# Patient Record
Sex: Female | Born: 2000 | Race: Black or African American | Hispanic: No | Marital: Single | Smoking: Never smoker
Health system: Southern US, Community
[De-identification: ages and names within clinical notes are randomized; demographics above are authoritative.]

## PROBLEM LIST (undated history)

## (undated) ENCOUNTER — Inpatient Hospital Stay (HOSPITAL_COMMUNITY): Payer: Self-pay

## (undated) DIAGNOSIS — Z789 Other specified health status: Secondary | ICD-10-CM

## (undated) HISTORY — PX: NO PAST SURGERIES: SHX2092

## (undated) HISTORY — DX: Other specified health status: Z78.9

---
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
## 2021-04-27 IMAGING — DX PORTABLE CHEST - 1 VIEW
1 series · 1 of 1 positions shown · non-contrast
Comparison: None.

CLINICAL DATA: Sepsis, syncope

EXAM:
PORTABLE CHEST 1 VIEW

[chest ap]
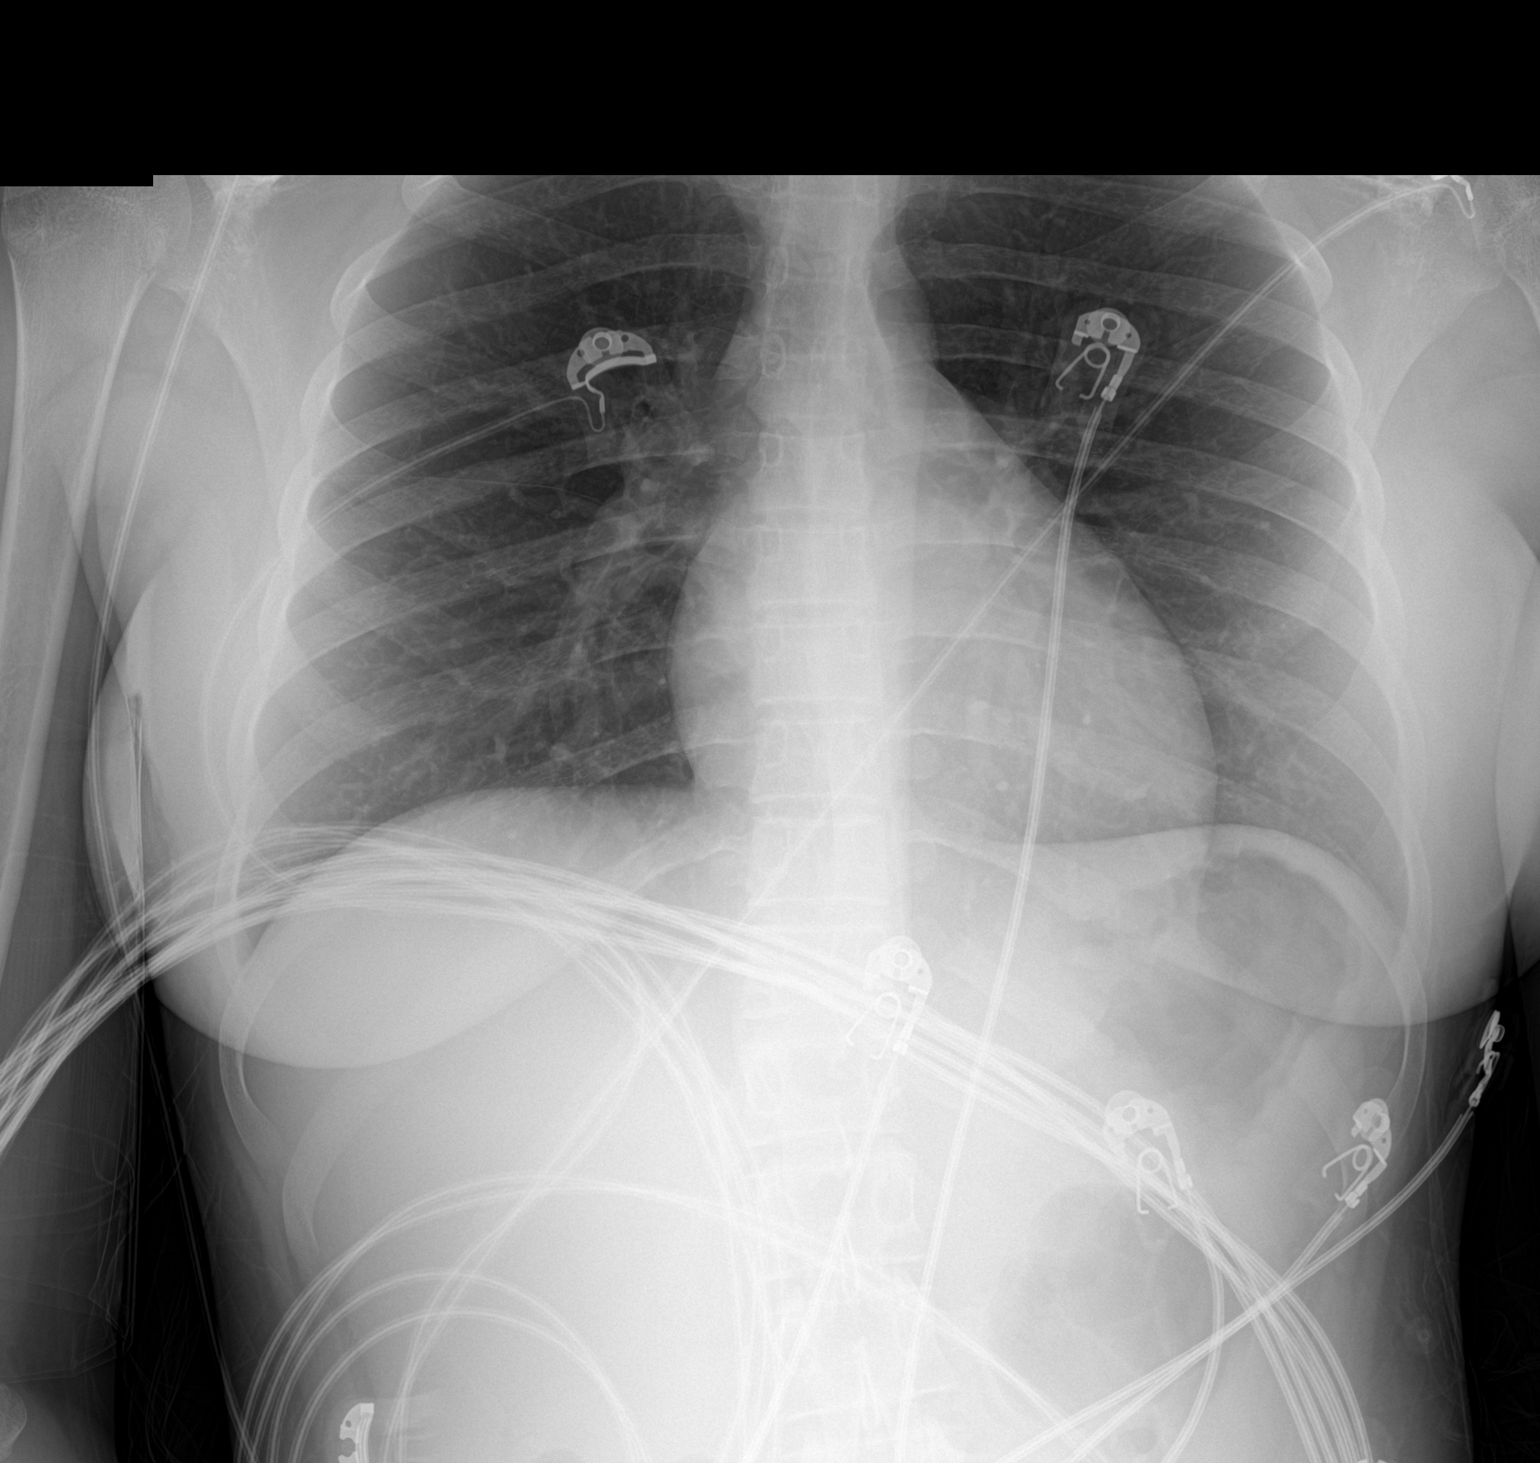

[1 of 1 positions shown; findings below may reference images not displayed]

FINDINGS: Heart and mediastinal contours are within normal limits. No focal
opacities or effusions. No acute bony abnormality.
IMPRESSION: No active disease.

## 2021-04-28 IMAGING — US US PELVIS COMPLETE
1 series · 13 of 25 positions shown · non-contrast
Comparison: None.

CLINICAL DATA: Fever, sepsis, evaluate for retained products of
conception. Three weeks postpartum. Lower abdominal pain.

EXAM:
TRANSABDOMINAL ULTRASOUND OF PELVIS
TECHNIQUE: Transabdominal ultrasound examination of the pelvis was performed
including evaluation of the uterus, ovaries, adnexal regions, and
pelvic cul-de-sac.

[Series 1: us pelvis complete · 51 acquisitions, 13 frames shown]
[im 1/51]
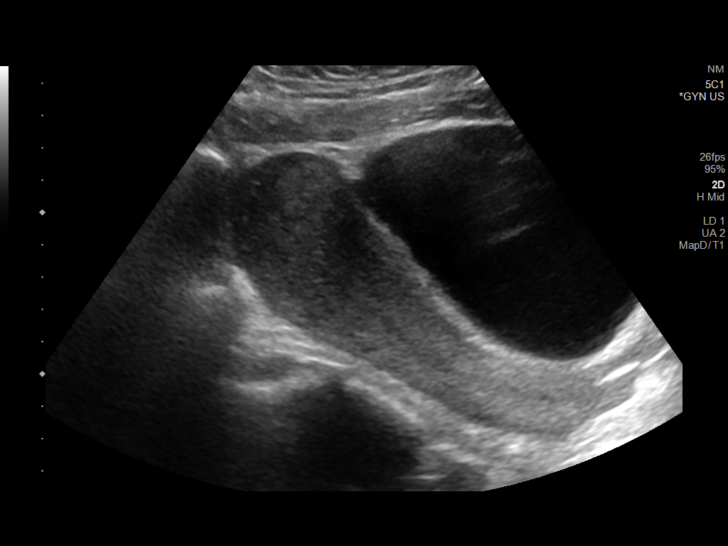
[im 5/51]
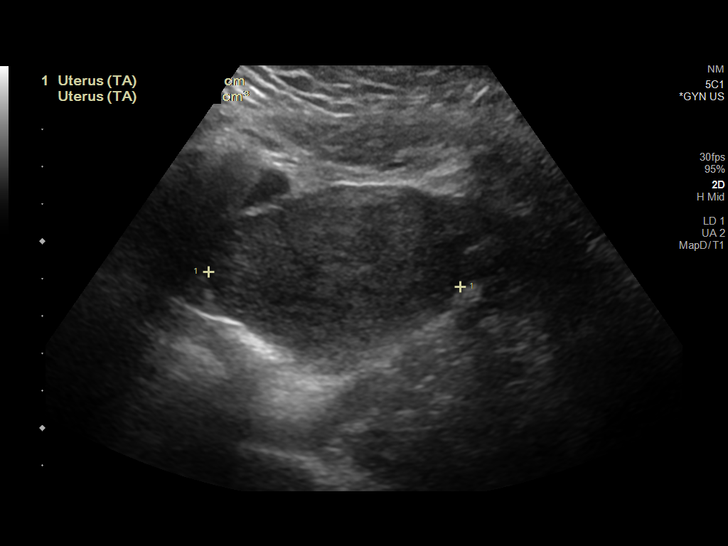
[im 9/51]
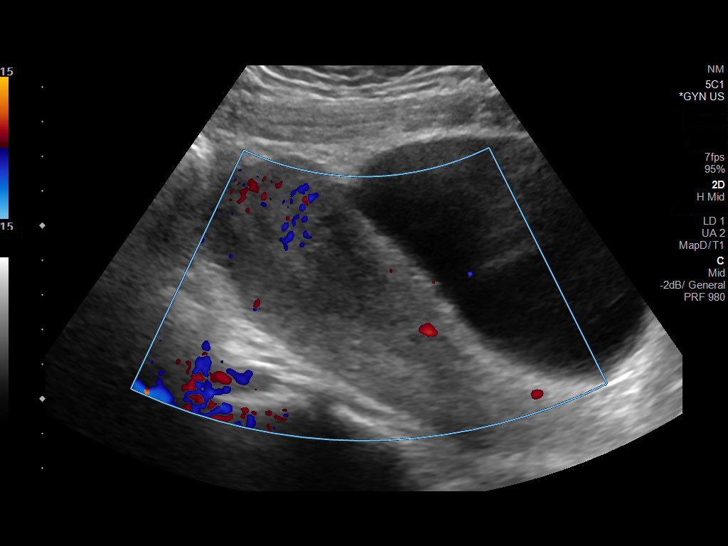
[im 13/51]
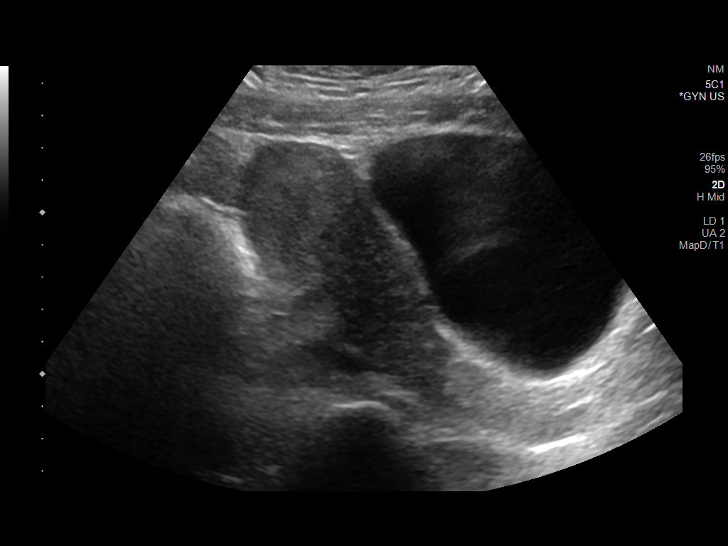
[im 17/51]
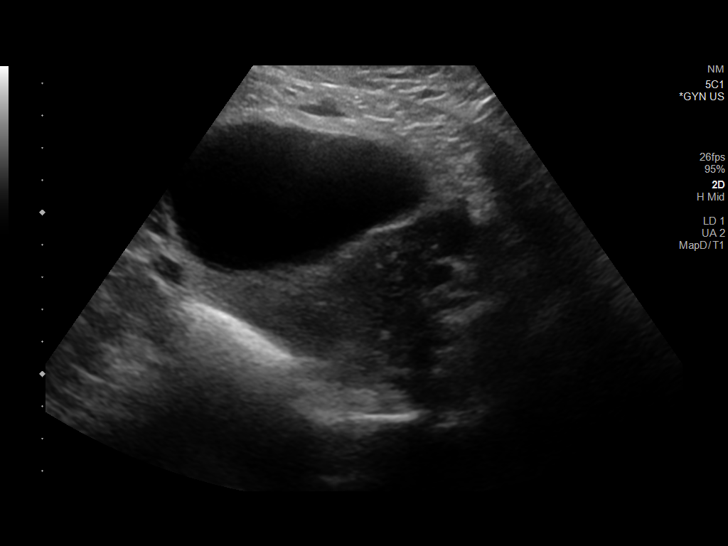
[im 21/51]
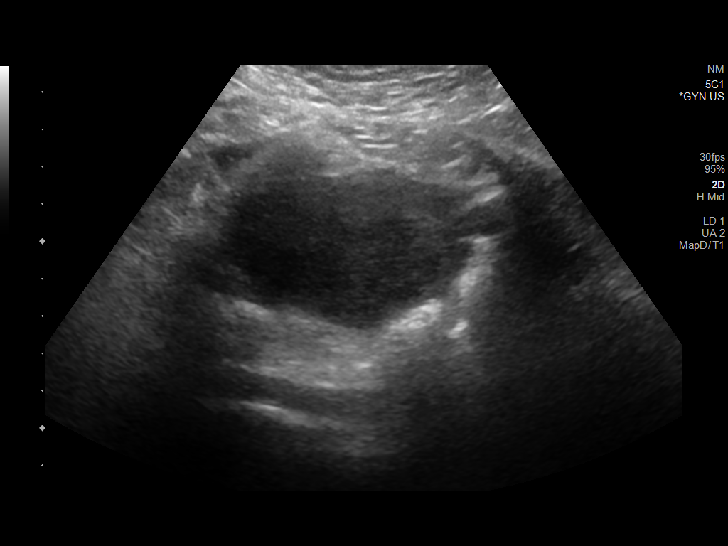
[im 26/51]
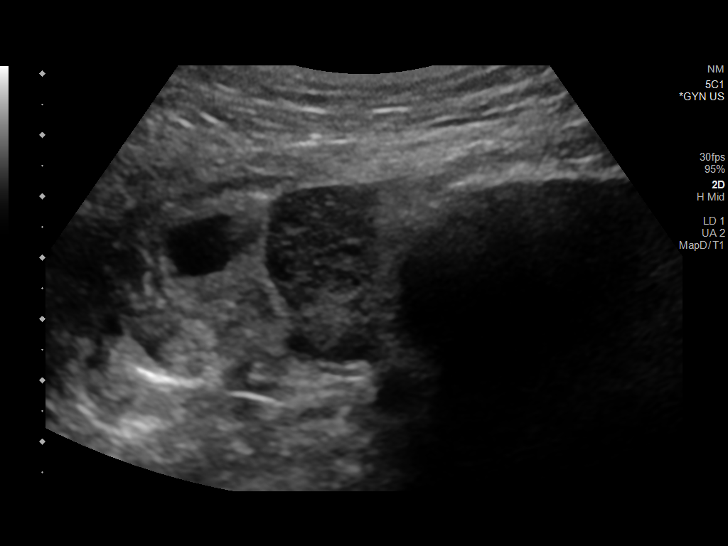
[im 30/51]
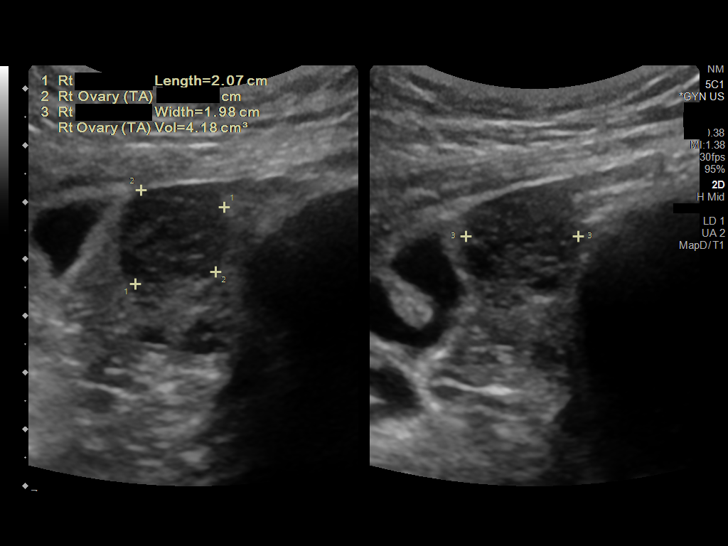
[im 34/51]
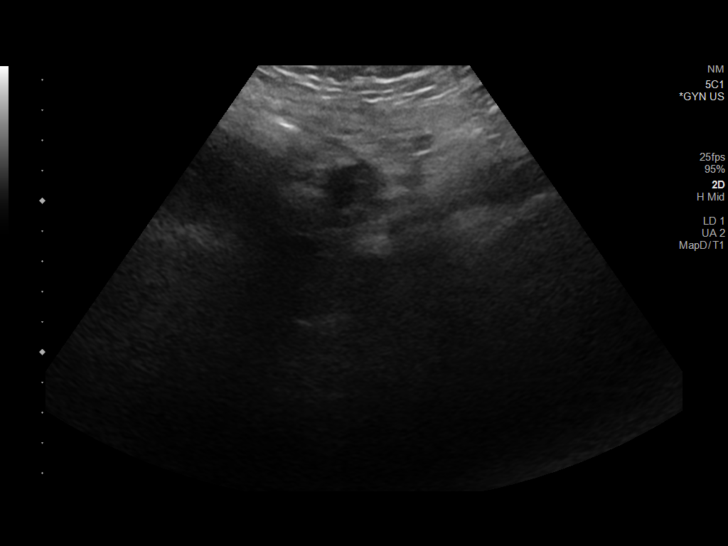
[im 38/51]
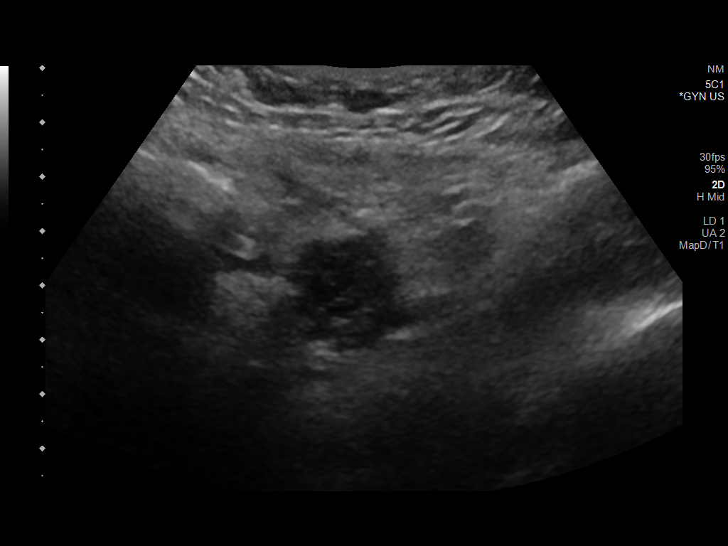
[im 42/51]
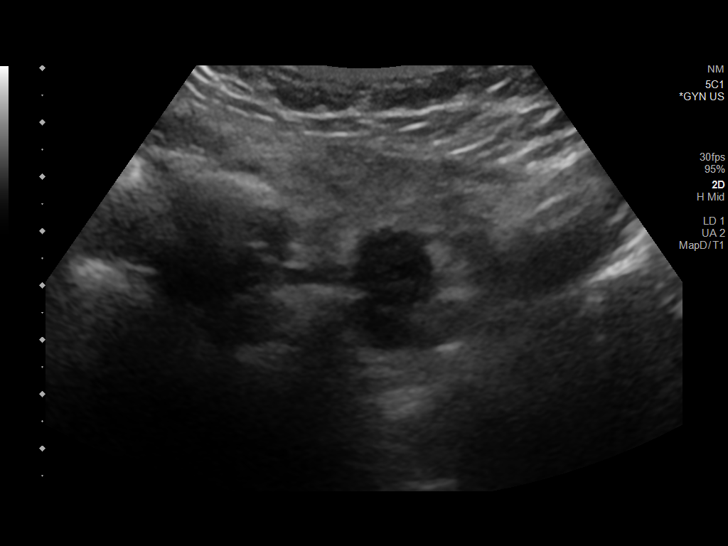
[im 46/51]
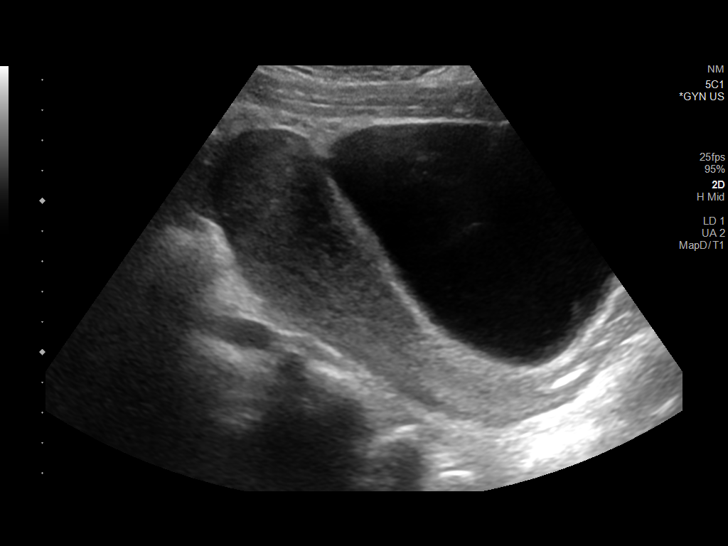
[im 51/51]
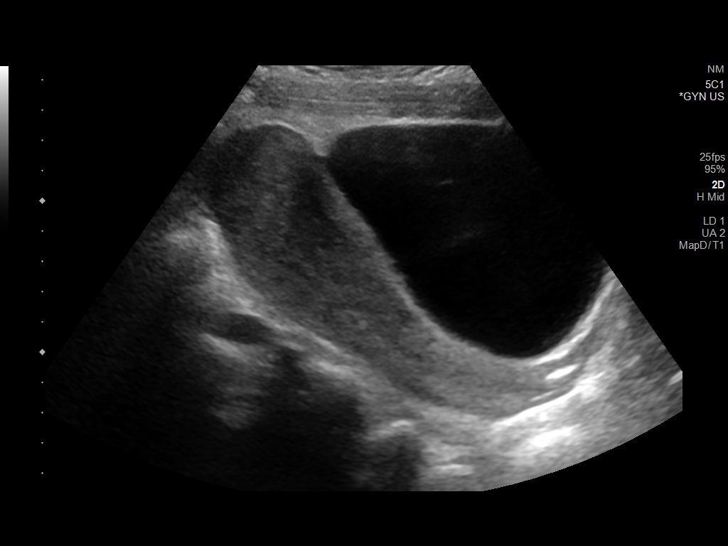

[13 of 25 positions shown; findings below may reference images not displayed]

FINDINGS: Uterus

Measurements: 12.8 x 4.3 x 6.8 cm = volume: 191 mL. No fibroids or
other mass visualized.

Endometrium

Thickness: 17 mm, borders not well-defined. No focal abnormality
visualized. No endometrial hypervascularity or blood flow.

Right ovary

Measurements: 2.1 x 2.0 x 2.0 cm = volume: 4.2 mL. Normal
appearance/no adnexal mass.

Left ovary

Measurements: 2.5 x 2.0 x 1.9 cm = volume: 4.9 mL. Normal
appearance/no adnexal mass.

Other findings:  No abnormal free fluid.
IMPRESSION: Endometrial thickness of 17 mm but no endometrial vascularity or
endometrial mass. Findings are nonspecific and could indicate
endometrial blood products although hypovascular retained products
of conception cannot be excluded. Consider short-term follow-up
pelvic ultrasound or further evaluation with pelvic MRI with and
without IV contrast as clinically warranted.
